# Patient Record
Sex: Male | Born: 1975 | Race: White | Hispanic: No | Marital: Married | Smoking: Never smoker
Health system: Southern US, Community
[De-identification: ages and names within clinical notes are randomized; demographics above are authoritative.]

---

## 2014-11-09 ENCOUNTER — Other Ambulatory Visit: Payer: Self-pay | Admitting: Urology

## 2014-11-09 DIAGNOSIS — N411 Chronic prostatitis: Secondary | ICD-10-CM

## 2014-11-16 ENCOUNTER — Ambulatory Visit
Admission: RE | Admit: 2014-11-16 | Discharge: 2014-11-16 | Disposition: A | Discharge status: Home / Self Care | Payer: 59 | Source: Ambulatory Visit | Attending: Urology | Admitting: Urology

## 2014-11-16 DIAGNOSIS — N411 Chronic prostatitis: Secondary | ICD-10-CM

## 2014-11-16 MED ORDER — IOPAMIDOL (ISOVUE-300) INJECTION 61%
100.0000 mL | Freq: Once | INTRAVENOUS | Status: AC | PRN
  Administered 2014-11-16: 100 mL via INTRAVENOUS

## 2017-05-15 ENCOUNTER — Ambulatory Visit (INDEPENDENT_AMBULATORY_CARE_PROVIDER_SITE_OTHER): Payer: 59 | Admitting: Orthopedic Surgery

## 2017-05-15 ENCOUNTER — Encounter (INDEPENDENT_AMBULATORY_CARE_PROVIDER_SITE_OTHER): Payer: Self-pay | Admitting: Orthopedic Surgery

## 2017-05-15 ENCOUNTER — Ambulatory Visit (INDEPENDENT_AMBULATORY_CARE_PROVIDER_SITE_OTHER): Payer: 59

## 2017-05-15 DIAGNOSIS — M6702 Short Achilles tendon (acquired), left ankle: Secondary | ICD-10-CM

## 2017-05-15 DIAGNOSIS — M79672 Pain in left foot: Secondary | ICD-10-CM | POA: Diagnosis not present

## 2017-05-15 DIAGNOSIS — M7742 Metatarsalgia, left foot: Secondary | ICD-10-CM

## 2017-05-15 DIAGNOSIS — M6701 Short Achilles tendon (acquired), right ankle: Secondary | ICD-10-CM

## 2017-05-15 DIAGNOSIS — M7741 Metatarsalgia, right foot: Secondary | ICD-10-CM

## 2017-05-15 MED ORDER — LIDOCAINE HCL 1 % IJ SOLN
1.0000 mL | INTRAMUSCULAR | Status: AC | PRN
  Administered 2017-05-15: 1 mL

## 2017-05-15 MED ORDER — METHYLPREDNISOLONE ACETATE 40 MG/ML IJ SUSP
40.0000 mg | INTRAMUSCULAR | Status: AC | PRN
  Administered 2017-05-15: 40 mg via INTRA_ARTICULAR

## 2017-05-15 MED ORDER — METHYLPREDNISOLONE ACETATE 40 MG/ML IJ SUSP
20.0000 mg | INTRAMUSCULAR | Status: AC | PRN
  Administered 2017-05-15: 20 mg via INTRA_ARTICULAR

## 2017-05-15 NOTE — Progress Notes (Signed)
Office Visit Note   Patient: Vincent Houston           Date of Birth: [birth year]           MRN: [id number] Visit Date: 05/15/2017              Requested by: No referring provider defined for this encounter. PCP: Sharilyn Sites, MD  Chief Complaint  Patient presents with  . Right Foot - Pain  . 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 He states that he started with symptoms that were more consistent with plantar fasciitis for several months. He has been O [place] for his psoriasis from January to August. Patient has had extensive workup of his foot has had an injection for Morton's neuroma which did not provide any relief he has a carbon plate made for the left shoe which has provided little relief and most recently a diagnosis of a stress fracture of the fourth metatarsal is currently been treated with a bone stimulator for 6 weeks without relief. Patient states his pain is worse in the morning.  Assessment & Plan: Visit Diagnoses:  1. Pain in left foot   2. Achilles tendon contracture, bilateral   3. Metatarsalgia of both feet     Plan: Blood work to see if there is a psoriatic arthritis component to the symptoms. We'll draw a uric acid sedimentation rate CRP rheumatoid factor and ANA. He will discontinue the bone stimulator. Recommended Hoka, trail running shoes with a sol orthotic. Recommended heel cord stretching and this was demonstrated to him. The MTP joints 3 and 4 were injected patient had immediate relief of his symptoms. Patient was given a pair of the medical compression stockings to help resolve the fungal rash on the plantar aspect of his foot.  Follow-Up Instructions: Return in about 1 week (around 05/22/2017).   Ortho Exam  Patient is alert, oriented, no adenopathy, well-dressed,  normal affect, normal respiratory effort. Examination patient has normal gait he has good pulses. He does have heel cord tightness with dorsiflexion only about 10 past neutral bilaterally with the knee extended. He has good ankle good subtalar motion. His foot is neurovascularly intact he has callus beneath the third and fourth metatarsal heads worse on the left foot than the right foot and is starting to develop callus over the fifth metatarsal head from him rolling the foot over to unload the third and fourth metatarsal heads. The web spaces are minimally tender to palpation lower compression and palpation of the third webspace does not produce any Morton's neuroma symptoms. He has maximum tenderness to palpation over the third and fourth metatarsal head and he is most tender to palpation over the third metatarsal head left foot. Review of the MRI scan shows more inflammation in the metatarsal head and joint of the fourth MTP joint.  Imaging: Xr Foot Complete Left  Result Date: 05/15/2017 Three-view radiographs of the left foot shows no bony abnormalities there is a long second and third metatarsal no evidence of stress fractures no evidence of increased callus formation. There is no joint space narrowing. 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  No images are attached to the encounter.  Labs: No results found for: HGBA1C, ESRSEDRATE, CRP, LABURIC, REPTSTATUS, GRAMSTAIN, CULT, LABORGA  Orders:  Orders  Placed This Encounter  Procedures  . XR Foot Complete Left  . Uric acid  . Sed Rate (ESR)  . C-reactive protein  . Rheumatoid Factor  . Antinuclear Antib (ANA)   No orders of the defined types were placed in this encounter.    Procedures: Small Joint Inj Date/Time: 05/15/2017 3:15 PM Performed by: Yaquelin Langelier V Authorized by: Newt Minion   Consent Given by:  Patient Site marked: the procedure site was marked   Timeout: prior to procedure the correct patient, procedure, and site was  verified   Indications:  Pain and diagnostic evaluation Location:  Third toe Site:  L third MTP Prep: patient was prepped and draped in usual sterile fashion   Needle Size:  27 G Spinal Needle: No   Approach:  Dorsal Ultrasound Guided: No   Fluoroscopic Guidance: No   Medications:  40 mg methylPREDNISolone acetate 40 MG/ML; 20 mg methylPREDNISolone acetate 40 MG/ML; 1 mL lidocaine 1 % Aspiration Attempted: No   Patient tolerance:  Patient tolerated the procedure well with no immediate complications Small Joint Inj Date/Time: 05/15/2017 3:15 PM Performed by: Ruhan Borak V Authorized by: Newt Minion   Consent Given by:  Patient Site marked: the procedure site was marked   Timeout: prior to procedure the correct patient, procedure, and site was verified   Indications:  Pain and diagnostic evaluation Location:  Fourth toe Site:  L fourth MTP Prep: patient was prepped and draped in usual sterile fashion   Needle Size:  27 G Spinal Needle: No   Approach:  Dorsal Ultrasound Guided: No   Fluoroscopic Guidance: No   Medications:  1 mL lidocaine 1 %; 40 mg methylPREDNISolone acetate 40 MG/ML Aspiration Attempted: No   Patient tolerance:  Patient tolerated the procedure well with no immediate complications    Clinical Data: No additional findings.  ROS:  All other systems negative, except as noted in the HPI. Review of Systems  Objective: Vital Signs: There were no vitals taken for this visit.  Specialty Comments:  No specialty comments available.  PMFS History: There are no active problems to display for this patient.  History reviewed. No pertinent past medical history.  History reviewed. No pertinent family history.  History reviewed. No pertinent surgical history. Social History   Occupational History  . Not on file.   Social History Main Topics  . Smoking status: Never Smoker  . Smokeless tobacco: Never Used  . Alcohol use No  . Drug use: No  . Sexual  activity: Not on file

## 2017-05-16 LAB — ANA: Anti Nuclear Antibody(ANA): NEGATIVE

## 2017-05-16 LAB — SEDIMENTATION RATE

## 2017-05-16 LAB — RHEUMATOID FACTOR: Rhuematoid fact SerPl-aCnc: <14 IU/mL (ref <14)

## 2017-05-16 LAB — URIC ACID: Uric Acid, Serum: 6.2 mg/dL (ref 4.0–8.0)

## 2017-05-16 LAB — C-REACTIVE PROTEIN: CRP: 7.1 mg/L (ref <8.0)

## 2017-05-22 ENCOUNTER — Ambulatory Visit (INDEPENDENT_AMBULATORY_CARE_PROVIDER_SITE_OTHER): Payer: 59 | Admitting: Orthopedic Surgery

## 2017-05-22 DIAGNOSIS — M7741 Metatarsalgia, right foot: Secondary | ICD-10-CM

## 2017-05-22 DIAGNOSIS — M79672 Pain in left foot: Secondary | ICD-10-CM

## 2017-05-22 DIAGNOSIS — M7742 Metatarsalgia, left foot: Secondary | ICD-10-CM

## 2017-05-22 MED ORDER — COLCHICINE 0.6 MG PO CAPS: 0.6000 mg | ORAL_CAPSULE | Freq: Every day | ORAL | 1 refills | Status: DC | PRN

## 2017-05-22 MED ORDER — ALLOPURINOL 100 MG PO TABS: 100.0000 mg | ORAL_TABLET | Freq: Every day | ORAL | 3 refills | Status: DC

## 2017-05-22 NOTE — Progress Notes (Signed)
Office Visit Note   Patient: Vincent Houston           Date of Birth: 1976-05-26           MRN: [id number] Visit Date: 05/22/2017              Requested by: Assunta Found, MD [address] [place], [place] [address] PCP: Assunta Found, MD  No chief complaint on file.     HPI: Patient presents in follow-up after injection for MTP joints 3 and 4 he did have immediate relief but still has swelling of the third and fourth toes on the left foot swelling of the fourth toe on the right foot. He states the compression stockings are feeling better. His uric acid was 6.2 the remainder of his rheumatologic studies were negative ANA was negative rheumatoid factor was normal C-reactive protein was normal. Patient states that his symptoms were worse when he was in [facility] where he was eating steak every night and drinking red wine every night. With his most recent uric acid of 6.2 it's possible that patient has now symptoms with the possibility of this being psoriatic arthritis as well.  Assessment & Plan: Visit Diagnoses:  1. Pain in left foot   2. Metatarsalgia of both feet     Plan: We will call in a prescription for colchicine 0.6 mg daily transitioning to allopurinol 100 mg daily. If patient does not have significant relief within a week of taking the colchicine he will call and I would then place him on a low-dose prednisone with the assumption that this is more of a psoriatic arthritis if the colchicine does not show a significant improvement in his symptoms.  Follow-Up Instructions: Return if symptoms worsen or fail to improve.   Ortho Exam  Patient is alert, oriented, no adenopathy, well-dressed, normal affect, normal respiratory effort. Examination the fungal rash beneath his toes and on his heel is improved after wearing the medical compression stocking. He does have swelling of the third and fourth toes the left foot swelling of the fourth toe on the right foot with a little bit of  swelling in the fifth toe of the right foot. There is no cellulitis he does have tenderness he has callus beneath the metatarsal heads of the midfoot on the left foot he has improved dorsiflexion the ankle. He is currently wearing Hoka sneakers and he states that these are an improvement. He does have sole orthotics and recommend using these in the sneakers as well.  Imaging: No results found. No images are attached to the encounter.  Labs: Lab Results  Component Value Date   ESRSEDRATE CANCELED 05/15/2017   CRP 7.1 05/15/2017   LABURIC 6.2 05/15/2017    Orders:  No orders of the defined types were placed in this encounter.  No orders of the defined types were placed in this encounter.    Procedures: No procedures performed  Clinical Data: No additional findings.  ROS:  All other systems negative, except as noted in the HPI. Review of Systems  Objective: Vital Signs: There were no vitals taken for this visit.  Specialty Comments:  No specialty comments available.  PMFS History: There are no active problems to display for this patient.  No past medical history on file.  No family history on file.  No past surgical history on file. Social History   Occupational History  . Not on file.   Social History Main Topics  . Smoking status: Never Smoker  .  Smokeless tobacco: Never Used  . Alcohol use No  . Drug use: No  . Sexual activity: Not on file

## 2017-05-29 ENCOUNTER — Other Ambulatory Visit (INDEPENDENT_AMBULATORY_CARE_PROVIDER_SITE_OTHER): Payer: Self-pay | Admitting: Orthopedic Surgery

## 2017-05-29 MED ORDER — PREDNISONE 10 MG PO TABS: 20.0000 mg | ORAL_TABLET | Freq: Every day | ORAL | 0 refills | Status: DC

## 2017-05-29 NOTE — Progress Notes (Signed)
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

## 2017-06-08 ENCOUNTER — Telehealth (INDEPENDENT_AMBULATORY_CARE_PROVIDER_SITE_OTHER): Payer: Self-pay | Admitting: Radiology

## 2017-06-08 MED ORDER — PREDNISONE 10 MG PO TABS: 20.0000 mg | ORAL_TABLET | Freq: Every day | ORAL | 0 refills | Status: DC

## 2017-06-08 NOTE — Telephone Encounter (Signed)
Patient needing refill on prednisone, rx sent into his pharmacy per MD.

## 2020-07-20 DIAGNOSIS — L4059 Other psoriatic arthropathy: Secondary | ICD-10-CM | POA: Diagnosis not present

## 2020-07-20 DIAGNOSIS — Z111 Encounter for screening for respiratory tuberculosis: Secondary | ICD-10-CM | POA: Diagnosis not present

## 2020-07-20 DIAGNOSIS — Z79899 Other long term (current) drug therapy: Secondary | ICD-10-CM | POA: Diagnosis not present

## 2020-07-20 DIAGNOSIS — L400 Psoriasis vulgaris: Secondary | ICD-10-CM | POA: Diagnosis not present

## 2020-08-19 DIAGNOSIS — Z20822 Contact with and (suspected) exposure to covid-19: Secondary | ICD-10-CM | POA: Diagnosis not present

## 2020-08-31 DIAGNOSIS — R5383 Other fatigue: Secondary | ICD-10-CM | POA: Diagnosis not present

## 2020-08-31 DIAGNOSIS — E559 Vitamin D deficiency, unspecified: Secondary | ICD-10-CM | POA: Diagnosis not present

## 2020-08-31 DIAGNOSIS — R635 Abnormal weight gain: Secondary | ICD-10-CM | POA: Diagnosis not present

## 2020-09-07 DIAGNOSIS — R635 Abnormal weight gain: Secondary | ICD-10-CM | POA: Diagnosis not present

## 2020-09-07 DIAGNOSIS — R5383 Other fatigue: Secondary | ICD-10-CM | POA: Diagnosis not present

## 2020-09-07 DIAGNOSIS — E559 Vitamin D deficiency, unspecified: Secondary | ICD-10-CM | POA: Diagnosis not present

## 2020-09-07 DIAGNOSIS — E237 Disorder of pituitary gland, unspecified: Secondary | ICD-10-CM | POA: Diagnosis not present

## 2020-09-09 DIAGNOSIS — E559 Vitamin D deficiency, unspecified: Secondary | ICD-10-CM | POA: Diagnosis not present

## 2020-09-09 DIAGNOSIS — E237 Disorder of pituitary gland, unspecified: Secondary | ICD-10-CM | POA: Diagnosis not present

## 2020-09-09 DIAGNOSIS — R635 Abnormal weight gain: Secondary | ICD-10-CM | POA: Diagnosis not present

## 2020-09-09 DIAGNOSIS — R5383 Other fatigue: Secondary | ICD-10-CM | POA: Diagnosis not present

## 2020-09-20 DIAGNOSIS — R030 Elevated blood-pressure reading, without diagnosis of hypertension: Secondary | ICD-10-CM | POA: Diagnosis not present

## 2020-09-20 DIAGNOSIS — L409 Psoriasis, unspecified: Secondary | ICD-10-CM | POA: Diagnosis not present

## 2020-09-20 DIAGNOSIS — L4050 Arthropathic psoriasis, unspecified: Secondary | ICD-10-CM | POA: Diagnosis not present

## 2020-09-20 DIAGNOSIS — R079 Chest pain, unspecified: Secondary | ICD-10-CM | POA: Diagnosis not present

## 2020-09-22 ENCOUNTER — Ambulatory Visit (INDEPENDENT_AMBULATORY_CARE_PROVIDER_SITE_OTHER): Payer: BC Managed Care – PPO | Admitting: Cardiovascular Disease

## 2020-09-22 ENCOUNTER — Other Ambulatory Visit: Payer: Self-pay

## 2020-09-22 ENCOUNTER — Encounter: Payer: Self-pay | Admitting: Cardiovascular Disease

## 2020-09-22 DIAGNOSIS — R0789 Other chest pain: Secondary | ICD-10-CM

## 2020-09-22 DIAGNOSIS — I10 Essential (primary) hypertension: Secondary | ICD-10-CM | POA: Diagnosis not present

## 2020-09-22 DIAGNOSIS — Z8249 Family history of ischemic heart disease and other diseases of the circulatory system: Secondary | ICD-10-CM | POA: Diagnosis not present

## 2020-09-22 NOTE — Addendum Note (Signed)
Addended by: Bernita Buffy on: 09/22/2020 10:01 AM   Modules accepted: Orders

## 2020-09-22 NOTE — Assessment & Plan Note (Signed)
Mr. Vincent Houston was referred to me by Dr. Phillips Odor for evaluation of new onset hypertension and atypical chest pain. He apparently had Covid last year and he thinks again earlier this year. He was vaccinated in December. His wife and daughter tested positive in early January after the family came back from the [place] on a trip. He apparently went to a local ER for IV fluids because of symptoms of dehydration and was noted to be significantly hypertensive. He brought a digital blood pressure cuff and demonstrated this reproducibly at home as well. He was in [place] on business and was prescribed Benicar which she took for 3 days and discontinued it. Blood pressure today is 132/82. He feels his blood pressure is coming back towards normal. It certainly possible that his blood pressure spike was related to Covid vaccine seen versus Covid infection due to binding of the H2 receptor. Encouraged by the fact that his blood pressure is better on no medication. I encouraged him to avoid salt and continue to monitor his blood pressure at home.

## 2020-09-22 NOTE — Progress Notes (Signed)
09/22/2020 Ignazio Kincaid   November 14, 1975  [id number]  Primary Physician Assunta Found, MD Primary Cardiologist: Runell Gess MD Nicholes Calamity, [place]  HPI:  Jaquay Posthumus is a 45 y.o. thin appearing married Caucasian male father of 2 young children referred by Dr. Phillips Odor, his PCP for evaluation of new onset hypertension and pleuritic chest pain. 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 He does not smoke. He does drink socially . There is a family history of heart disease with father who had myocardial infarction in his 9s and had stents. He has never had a heart tachycardia or stroke. He did have COVID-19 last year and symptomatically earlier this year when his wife and child tested positive after spending the holidays in the [place]. He did receive his Covid shot in December. He apparently went to get IV fluids with his wife after by his account "partying" and was noted to be significantly hypertensive. He flew out to [place] on business trip and was started on Benicar by his PCP which he took for 3 days and then stopped. Blood pressure is significantly improved. His chest pain which is pleuritic in nature is improved as well.   Current Meds  Medication Sig  . Adalimumab (HUMIRA PEN) 40 MG/0.4ML PNKT   . olmesartan (BENICAR) 40 MG tablet Take 40 mg by mouth daily.     No Known Allergies  Social History   Socioeconomic History  . Marital status: Married    Spouse name: Not on file  . Number of children: Not on file  . Years of education: Not on file  . Highest education level: Not on file  Occupational History  . Not on file  Tobacco Use  . Smoking status: Never Smoker  . Smokeless tobacco: Never Used  Substance and Sexual Activity  . Alcohol use: No  . Drug use: No  . Sexual activity: Not on file  Other Topics Concern  . Not on file  Social History Narrative  . Not on file   Social Determinants of  Health   Financial Resource Strain: Not on file  Food Insecurity: Not on file  Transportation Needs: Not on file  Physical Activity: Not on file  Stress: Not on file  Social Connections: Not on file  Intimate Partner Violence: Not on file     Review of Systems: General: negative for chills, fever, night sweats or weight changes.  Cardiovascular: negative for chest pain, dyspnea on exertion, edema, orthopnea, palpitations, paroxysmal nocturnal dyspnea or shortness of breath Dermatological: negative for rash Respiratory: negative for cough or wheezing Urologic: negative for hematuria Abdominal: negative for nausea, vomiting, diarrhea, bright red blood per rectum, melena, or hematemesis Neurologic: negative for visual changes, syncope, or dizziness All other systems reviewed and are otherwise negative except as noted above.    Blood pressure 132/82, pulse (!) 52, height 6' (1.829 m), weight 184 lb 12.8 oz (83.8 kg).  General appearance: alert and no distress Neck: no adenopathy, no carotid bruit, no JVD, supple, symmetrical, trachea midline and thyroid not enlarged, symmetric, no tenderness/mass/nodules Lungs: clear to auscultation bilaterally Heart: regular rate and rhythm, S1, S2 normal, no murmur, click, rub or gallop Extremities: extremities normal, atraumatic, no cyanosis or edema Pulses: 2+ and symmetric Skin: Skin color, texture, turgor normal. No rashes or lesions Neurologic: Alert and oriented X 3, normal strength and tone. Normal symmetric reflexes. Normal coordination and gait  EKG  sinus bradycardia 52 with borderline LVH voltage. I personally reviewed this EKG.  ASSESSMENT AND PLAN:   Essential hypertension Mr. Derryl Harbor was referred to me by Dr. Phillips Odor for evaluation of new onset hypertension and atypical chest pain. He apparently had Covid last year and he thinks again earlier this year. He was vaccinated in December. His wife and daughter tested positive in early  January after the family came back from the [place] on a trip. He apparently went to a local ER for IV fluids because of symptoms of dehydration and was noted to be significantly hypertensive. He brought a digital blood pressure cuff and demonstrated this reproducibly at home as well. He was in [place] on business and was prescribed Benicar which she took for 3 days and discontinued it. Blood pressure today is 132/82. He feels his blood pressure is coming back towards normal. It certainly possible that his blood pressure spike was related to Covid vaccine seen versus Covid infection due to binding of the H2 receptor. Encouraged by the fact that his blood pressure is better on no medication. I encouraged him to avoid salt and continue to monitor his blood pressure at home.  Atypical chest pain Mr. Derryl Harbor was referred for hypertension atypical chest pain. The pain is mostly pleuritic. It is significantly improved. I am going to get a D-dimer, sed rate and CRP as well as a 2D echo and coronary calcium score to further evaluate.  Family history of heart disease Father had a myocardial infarction in multiple stents beginning in his 23s.      Runell Gess MD FACP,FACC,FAHA, [facility] 09/22/2020 9:49 AM

## 2020-09-22 NOTE — Assessment & Plan Note (Signed)
Mr. Vincent Houston was referred for hypertension atypical chest pain. The pain is mostly pleuritic. It is significantly improved. I am going to get a D-dimer, sed rate and CRP as well as a 2D echo and coronary calcium score to further evaluate.

## 2020-09-22 NOTE — Assessment & Plan Note (Signed)
Father had a myocardial infarction in multiple stents beginning in his 14s.

## 2020-09-22 NOTE — Patient Instructions (Addendum)
Medication Instructions:  Your physician recommends that you continue on your current medications as directed. Please refer to the Current Medication list given to you today.  *If you need a refill on your cardiac medications before your next appointment, please call your pharmacy*   Lab Work: Your physician recommends that you have labs today: Lipid/liver profile, D-dimer, CRP & Sed rate  If you have labs (blood work) drawn today and your tests are completely normal, you will receive your results only by: Marland Kitchen MyChart Message (if you have MyChart) OR . A paper copy in the mail If you have any lab test that is abnormal or we need to change your treatment, we will call you to review the results.   Testing/Procedures: Your physician has requested that you have an echocardiogram. Echocardiography is a painless test that uses sound waves to create images of your heart. It provides your doctor with information about the size and shape of your heart and how well your heart's chambers and valves are working. This procedure takes approximately one hour. There are no restrictions for this procedure. This will be done at [address] [place], [address]rd Floor  Dr. Allyson Sabal has ordered a CT coronary calcium score. This test is done at [address]. This is $99 out of pocket.   Coronary CalciumScan A coronary calcium scan is an imaging test used to look for deposits of calcium and other fatty materials (plaques) in the inner lining of the blood vessels of the heart (coronary arteries). These deposits of calcium and plaques can partly clog and narrow the coronary arteries without producing any symptoms or warning signs. This puts a person at risk for a heart attack. This test can detect these deposits before symptoms develop. Tell a health care provider about:  Any allergies you have.  All medicines you are taking, including vitamins, herbs, eye drops, creams, and over-the-counter  medicines.  Any problems you or family members have had with anesthetic medicines.  Any blood disorders you have.  Any surgeries you have had.  Any medical conditions you have.  Whether you are pregnant or may be pregnant. What are the risks? Generally, this is a safe procedure. However, problems may occur, including:  Harm to a pregnant woman and her unborn baby. This test involves the use of radiation. Radiation exposure can be dangerous to a pregnant woman and her unborn baby. If you are pregnant, you generally should not have this procedure done.  Slight increase in the risk of cancer. This is because of the radiation involved in the test. What happens before the procedure? No preparation is needed for this procedure. What happens during the procedure?  You will undress and remove any jewelry around your neck or chest.  You will put on a hospital gown.  Sticky electrodes will be placed on your chest. The electrodes will be connected to an electrocardiogram (ECG) machine to record a tracing of the electrical activity of your heart.  A CT scanner will take pictures of your heart. During this time, you will be asked to lie still and hold your breath for 2-3 seconds while a picture of your heart is being taken. The procedure may vary among health care providers and hospitals. What happens after the procedure?  You can get dressed.  You can return to your normal activities.  It is up to you to get the results of your test. Ask your health care provider, or the department that is  doing the test, when your results will be ready. Summary  A coronary calcium scan is an imaging test used to look for deposits of calcium and other fatty materials (plaques) in the inner lining of the blood vessels of the heart (coronary arteries).  Generally, this is a safe procedure. Tell your health care provider if you are pregnant or may be pregnant.  No preparation is needed for this  procedure.  A CT scanner will take pictures of your heart.  You can return to your normal activities after the scan is done. This information is not intended to replace advice given to you by your health care provider. Make sure you discuss any questions you have with your health care provider. Document Released: 02/10/2008 Document Revised: 07/03/2016 Document Reviewed: 07/03/2016 Elsevier Interactive Patient Education  2017 [employer].    Follow-Up: At [facility], you and your health needs are our priority.  As part of our continuing mission to provide you with exceptional heart care, we have created designated Provider Care Teams.  These Care Teams include your primary Cardiologist (physician) and Advanced Practice Providers (APPs -  Physician Assistants and Nurse Practitioners) who all work together to provide you with the care you need, when you need it.  We recommend signing up for the patient portal called "MyChart".  Sign up information is provided on this After Visit Summary.  MyChart is used to connect with patients for Virtual Visits (Telemedicine).  Patients are able to view lab/test results, encounter notes, upcoming appointments, etc.  Non-urgent messages can be sent to your provider as well.   To learn more about what you can do with MyChart, go to [email or web address].    Your next appointment:   2 month(s)  The format for your next appointment:   In Person  Provider:   Nanetta Batty, MD

## 2020-10-08 ENCOUNTER — Telehealth (HOSPITAL_COMMUNITY): Payer: Self-pay | Admitting: Emergency Medicine

## 2020-10-08 ENCOUNTER — Other Ambulatory Visit: Payer: Self-pay | Admitting: *Deleted

## 2020-10-08 DIAGNOSIS — Z8249 Family history of ischemic heart disease and other diseases of the circulatory system: Secondary | ICD-10-CM

## 2020-10-08 DIAGNOSIS — R079 Chest pain, unspecified: Secondary | ICD-10-CM

## 2020-10-08 NOTE — Telephone Encounter (Signed)
Reaching out to patient to offer assistance regarding upcoming cardiac imaging study; pt verbalizes understanding of appt date/time, parking situation and where to check in, pre-test NPO status and medications ordered, and verified current allergies; name and call back number provided for further questions should they arise Korissa Horsford RN Navigator Cardiac Imaging [facility] [phone] office [phone] cell 

## 2020-10-11 ENCOUNTER — Ambulatory Visit (HOSPITAL_COMMUNITY)
Admission: RE | Admit: 2020-10-11 | Discharge: 2020-10-11 | Disposition: A | Discharge status: Home / Self Care | Payer: BC Managed Care – PPO | Source: Ambulatory Visit | Attending: Cardiovascular Disease | Admitting: Cardiovascular Disease

## 2020-10-11 ENCOUNTER — Other Ambulatory Visit: Payer: Self-pay

## 2020-10-11 DIAGNOSIS — N419 Inflammatory disease of prostate, unspecified: Secondary | ICD-10-CM | POA: Diagnosis not present

## 2020-10-11 DIAGNOSIS — R079 Chest pain, unspecified: Secondary | ICD-10-CM

## 2020-10-11 DIAGNOSIS — Z8249 Family history of ischemic heart disease and other diseases of the circulatory system: Secondary | ICD-10-CM | POA: Insufficient documentation

## 2020-10-11 MED ORDER — NITROGLYCERIN 0.4 MG SL SUBL
0.8000 mg | SUBLINGUAL_TABLET | Freq: Once | SUBLINGUAL | Status: AC
  Administered 2020-10-11: 0.4 mg via SUBLINGUAL
  Administered 2020-10-11: 0.8 mg via SUBLINGUAL

## 2020-10-11 MED ORDER — METOPROLOL TARTRATE 5 MG/5ML IV SOLN
5.0000 mg | INTRAVENOUS | Status: DC | PRN
  Administered 2020-10-11: 5 mg via INTRAVENOUS

## 2020-10-11 MED ORDER — NITROGLYCERIN 0.4 MG SL SUBL
SUBLINGUAL_TABLET | SUBLINGUAL | Status: AC
  Filled 2020-10-11: qty 2

## 2020-10-11 MED ORDER — METOPROLOL TARTRATE 5 MG/5ML IV SOLN
INTRAVENOUS | Status: AC
  Filled 2020-10-11: qty 5

## 2020-10-11 MED ORDER — NITROGLYCERIN 0.4 MG SL SUBL
SUBLINGUAL_TABLET | SUBLINGUAL | Status: AC
  Filled 2020-10-11: qty 1

## 2020-10-11 MED ORDER — IOHEXOL 350 MG/ML SOLN
80.0000 mL | Freq: Once | INTRAVENOUS | Status: AC | PRN
  Administered 2020-10-11: 80 mL via INTRAVENOUS

## 2020-10-12 ENCOUNTER — Encounter: Payer: Self-pay | Admitting: *Deleted

## 2020-10-12 ENCOUNTER — Telehealth: Payer: Self-pay

## 2020-10-12 ENCOUNTER — Other Ambulatory Visit: Payer: Self-pay

## 2020-10-12 ENCOUNTER — Ambulatory Visit (INDEPENDENT_AMBULATORY_CARE_PROVIDER_SITE_OTHER): Payer: BC Managed Care – PPO

## 2020-10-12 DIAGNOSIS — R002 Palpitations: Secondary | ICD-10-CM

## 2020-10-12 NOTE — Progress Notes (Signed)
Patient ID: Vincent Houston, male   DOB: 10-Apr-1976, 45 y.o.   MRN: [id number] Patient enrolled for [employer] to ship a 14 day ZIO XT long term holter monitor to his home. Letter with instructions mailed to patient.

## 2020-10-12 NOTE — Telephone Encounter (Signed)
Attempted to call pt regarding 2 week zio per Dr. Allyson Sabal.  Left message for pt to call back. Orders for 14 day zio placed and letter with instructions placed in the mail.

## 2020-10-16 DIAGNOSIS — N200 Calculus of kidney: Secondary | ICD-10-CM | POA: Diagnosis not present

## 2020-10-16 DIAGNOSIS — N132 Hydronephrosis with renal and ureteral calculous obstruction: Secondary | ICD-10-CM | POA: Diagnosis not present

## 2020-10-16 DIAGNOSIS — R109 Unspecified abdominal pain: Secondary | ICD-10-CM | POA: Diagnosis not present

## 2020-10-19 ENCOUNTER — Encounter (HOSPITAL_COMMUNITY): Payer: Self-pay

## 2020-10-19 ENCOUNTER — Other Ambulatory Visit (HOSPITAL_COMMUNITY): Payer: BC Managed Care – PPO

## 2020-10-19 ENCOUNTER — Encounter (HOSPITAL_COMMUNITY): Payer: Self-pay | Admitting: Cardiovascular Disease

## 2020-10-19 NOTE — Progress Notes (Signed)
Verified appointment "no show" status with L. Walters at 08:32.  

## 2020-11-02 ENCOUNTER — Telehealth (HOSPITAL_COMMUNITY): Payer: Self-pay | Admitting: Cardiovascular Disease

## 2020-11-02 NOTE — Telephone Encounter (Signed)
Just an FYI. We have made several attempts to contact this patient including sending a letter to schedule or reschedule their echocardiogram. We will be removing the patient from the echo WQ.   10/19/20 NO SHOW-MAILED LETTER [facility]     Thank you

## 2020-11-04 DIAGNOSIS — R002 Palpitations: Secondary | ICD-10-CM | POA: Diagnosis not present

## 2021-04-18 DIAGNOSIS — M7041 Prepatellar bursitis, right knee: Secondary | ICD-10-CM | POA: Diagnosis not present

## 2021-06-06 DIAGNOSIS — I10 Essential (primary) hypertension: Secondary | ICD-10-CM | POA: Diagnosis not present

## 2021-06-06 DIAGNOSIS — Z6824 Body mass index (BMI) 24.0-24.9, adult: Secondary | ICD-10-CM | POA: Diagnosis not present

## 2021-06-06 DIAGNOSIS — L409 Psoriasis, unspecified: Secondary | ICD-10-CM | POA: Diagnosis not present

## 2021-06-06 DIAGNOSIS — Z1389 Encounter for screening for other disorder: Secondary | ICD-10-CM | POA: Diagnosis not present

## 2021-06-06 DIAGNOSIS — L4050 Arthropathic psoriasis, unspecified: Secondary | ICD-10-CM | POA: Diagnosis not present

## 2021-06-06 DIAGNOSIS — R300 Dysuria: Secondary | ICD-10-CM | POA: Diagnosis not present

## 2021-06-09 DIAGNOSIS — N5082 Scrotal pain: Secondary | ICD-10-CM | POA: Diagnosis not present

## 2021-06-09 DIAGNOSIS — N433 Hydrocele, unspecified: Secondary | ICD-10-CM | POA: Diagnosis not present

## 2021-06-10 DIAGNOSIS — N200 Calculus of kidney: Secondary | ICD-10-CM | POA: Diagnosis not present

## 2021-06-10 DIAGNOSIS — N411 Chronic prostatitis: Secondary | ICD-10-CM | POA: Diagnosis not present

## 2021-06-10 DIAGNOSIS — R102 Pelvic and perineal pain: Secondary | ICD-10-CM | POA: Diagnosis not present

## 2021-06-14 DIAGNOSIS — N209 Urinary calculus, unspecified: Secondary | ICD-10-CM | POA: Diagnosis not present

## 2021-06-29 DIAGNOSIS — Z9225 Personal history of immunosupression therapy: Secondary | ICD-10-CM | POA: Diagnosis not present

## 2021-06-29 DIAGNOSIS — Z111 Encounter for screening for respiratory tuberculosis: Secondary | ICD-10-CM | POA: Diagnosis not present

## 2021-06-29 DIAGNOSIS — L400 Psoriasis vulgaris: Secondary | ICD-10-CM | POA: Diagnosis not present

## 2021-06-29 DIAGNOSIS — L4059 Other psoriatic arthropathy: Secondary | ICD-10-CM | POA: Diagnosis not present

## 2021-06-29 DIAGNOSIS — Z79899 Other long term (current) drug therapy: Secondary | ICD-10-CM | POA: Diagnosis not present

## 2021-07-14 IMAGING — CT CT HEART MORP W/ CTA COR W/ SCORE W/ CA W/CM &/OR W/O CM
4 of 7 series · 8 of 20 positions shown, 9 images · non-contrast
Comparison: None.
COMPARISON: None.

Addendum:
EXAM:
OVER-READ INTERPRETATION  CT CHEST

The following report is an over-read performed by radiologist Dr.
Nakeya Farms [[email or web address]] on 10/11/2020. This over-read
does not include interpretation of cardiac or coronary anatomy or
pathology. The coronary CTA interpretation by the cardiologist is
attached.
CLINICAL DATA: Chest pain
Cardiac/Coronary CTA
TECHNIQUE: The patient was scanned on a [employer] scanner. A 110 kV
prospective scan was triggered in the descending thoracic aorta at
111 HU's. Axial non-contrast 3 mm slices were carried out through
the heart. The data set was analyzed on a dedicated work station and
scored using the Agatson method. Gantry rotation speed was 250 msecs
and collimation was .6 mm. 5 mg IV metoprolol and 0.8 mg of sl NTG
was given. The 3D data set was reconstructed in 5% intervals of the
35-75 % of the R-R cycle. Diastolic phases were analyzed on a
dedicated work station using MPR, MIP and VRT modes. The patient
received 80 cc of contrast.

[Series 6: best diast 72 % · axial · 0.39mm/px · z∈[+1178,+1221]mm · 2 of 328 slices shown, 3 images]
[im 110/328  vessel]
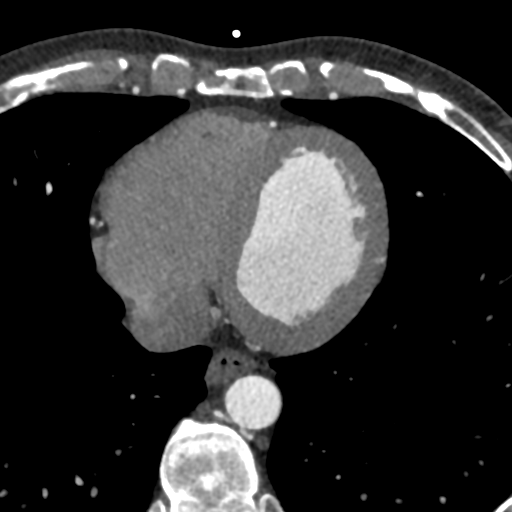
[im 110/328  lung]
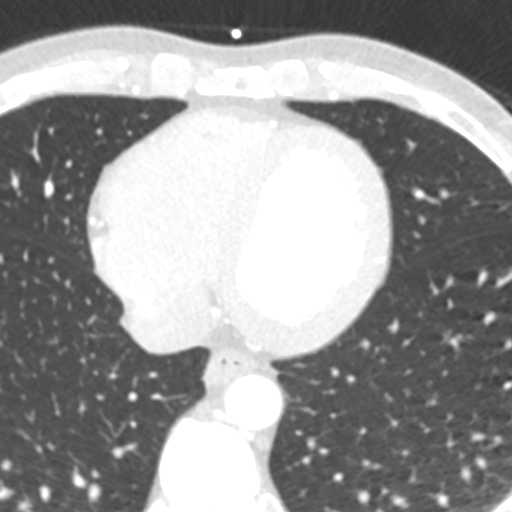
[im 219/328  vessel]
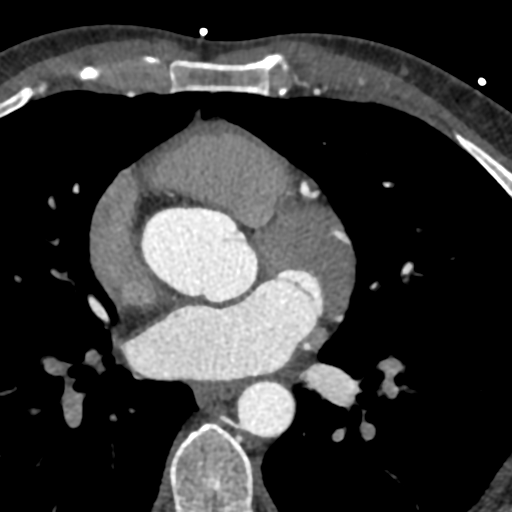

[Series 7: best syst 28 % · axial · 0.39mm/px · z∈[+1178,+1221]mm · 2 of 328 slices shown]
[im 110/328  vessel]
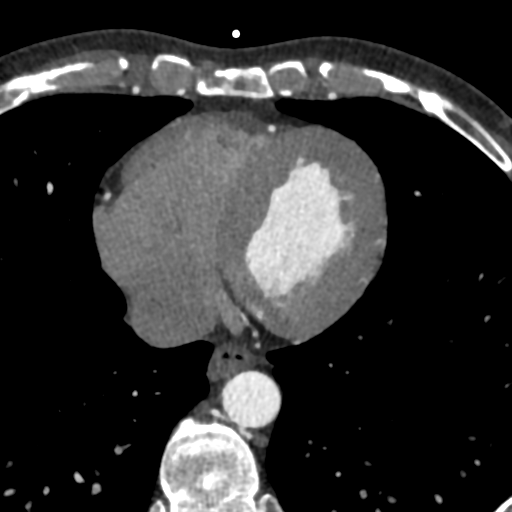
[im 219/328  vessel]
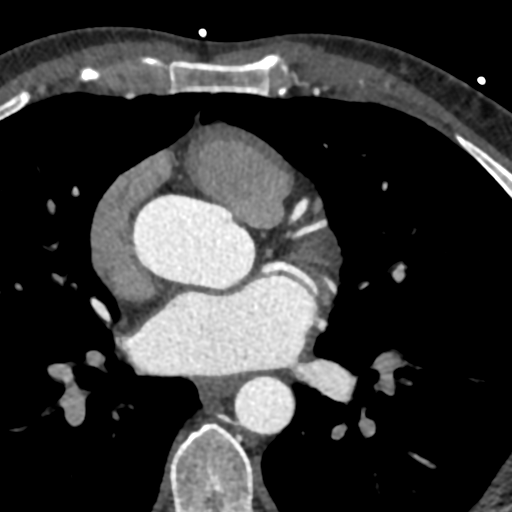

[Series 8: ts diast sharp 72 % · axial · 0.39mm/px · z∈[+1178,+1221]mm · 2 of 328 slices shown]
[im 110/328  lung]
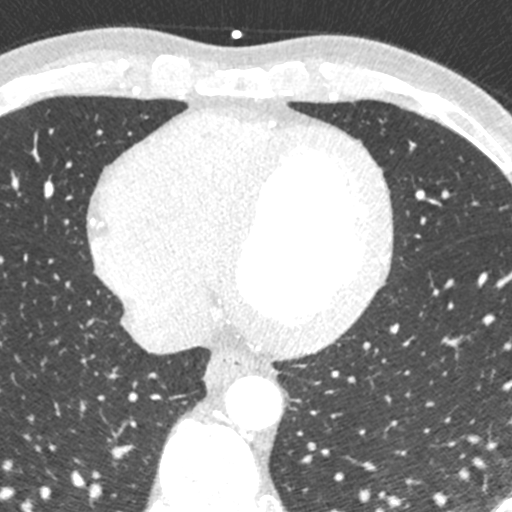
[im 219/328  lung]
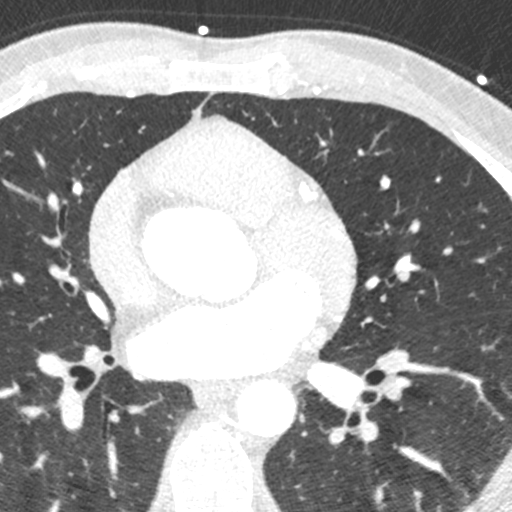

[Series 9: ts syst sharp 28 % · axial · 0.39mm/px · z∈[+1178,+1221]mm · 2 of 328 slices shown]
[im 110/328  lung]
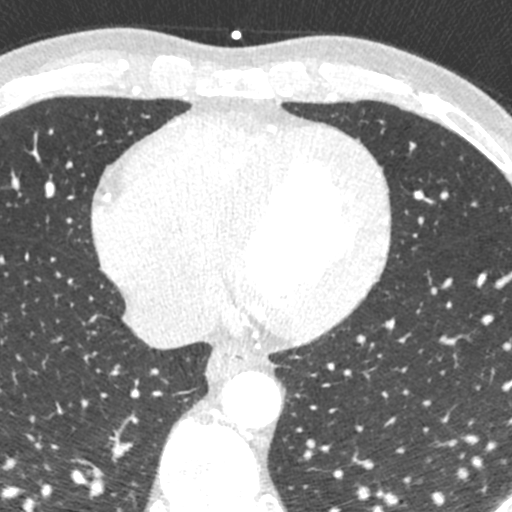
[im 219/328  lung]
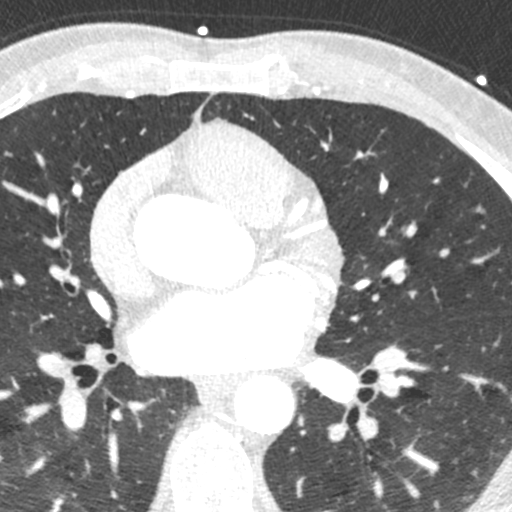

[8 of 20 positions shown; findings below may reference images not displayed]

FINDINGS: Vascular: Heart is normal size.  Aorta normal caliber.

Mediastinum/Nodes: No adenopathy.

Lungs/Pleura: No confluent opacities or effusions.

Upper Abdomen: Imaging into the upper abdomen demonstrates no acute
findings.

Musculoskeletal: Mild bilateral gynecomastia. No acute bony
abnormality.
IMPRESSION: No acute extra cardiac abnormality.

Mild bilateral gynecomastia.
FINDINGS: Image quality: Excellent.

Noise artifact is: Limited.

Coronary Arteries:  Normal coronary origin.  Right dominance.

Left main: The left main is a large caliber vessel with a normal
take off from the left coronary cusp that bifurcates to form a left
anterior descending artery and a left circumflex artery. There is no
plaque or stenosis.

Left anterior descending artery: The LAD is patent without evidence
of plaque or stenosis. The LAD gives off 3 patent diagonal branches.

Left circumflex artery: The LCX is non-dominant and patent with no
evidence of plaque or stenosis. The LCX gives off 2 patent obtuse
marginal branches.

Right coronary artery: The RCA is dominant with normal take off from
the right coronary cusp. There is no evidence of plaque or stenosis.
The RCA terminates as a PDA without evidence of plaque or stenosis.

Right Atrium: Right atrial size is within normal limits.

Right Ventricle: The right ventricular cavity is within normal
limits.

Left Atrium: Left atrial size is normal in size with no left atrial
appendage filling defect.

Left Ventricle: The ventricular cavity size is within normal limits.
There are no stigmata of prior infarction. There is no abnormal
filling defect.

Pulmonary arteries: Normal in size without proximal filling defect.

Pulmonary veins: Normal pulmonary venous drainage.

Pericardium: Normal thickness with no significant effusion or
calcium present.

Cardiac valves: The aortic valve is trileaflet without significant
calcification. The mitral valve is normal structure without
significant calcification.

Aorta: Normal caliber with no significant disease.

Extra-cardiac findings: See attached radiology report for
non-cardiac structures.
IMPRESSION: 1. Coronary calcium score of 0.

2. Normal coronary origin with right dominance.

3. Normal coronary arteries.

RECOMMENDATIONS:
1. No evidence of CAD (0%). Consider non-atherosclerotic causes of
chest pain.

*** End of Addendum ***
EXAM:
OVER-READ INTERPRETATION  CT CHEST

The following report is an over-read performed by radiologist Dr.
Nakeya Farms [[email or web address]] on 10/11/2020. This over-read
does not include interpretation of cardiac or coronary anatomy or
pathology. The coronary CTA interpretation by the cardiologist is
attached.
FINDINGS: Vascular: Heart is normal size.  Aorta normal caliber.

Mediastinum/Nodes: No adenopathy.

Lungs/Pleura: No confluent opacities or effusions.

Upper Abdomen: Imaging into the upper abdomen demonstrates no acute
findings.

Musculoskeletal: Mild bilateral gynecomastia. No acute bony
abnormality.
IMPRESSION: No acute extra cardiac abnormality.

Mild bilateral gynecomastia.

## 2021-09-05 DIAGNOSIS — H66002 Acute suppurative otitis media without spontaneous rupture of ear drum, left ear: Secondary | ICD-10-CM | POA: Diagnosis not present

## 2021-09-11 DIAGNOSIS — H6982 Other specified disorders of Eustachian tube, left ear: Secondary | ICD-10-CM | POA: Diagnosis not present

## 2021-09-11 DIAGNOSIS — H6690 Otitis media, unspecified, unspecified ear: Secondary | ICD-10-CM | POA: Diagnosis not present

## 2021-09-21 DIAGNOSIS — M5431 Sciatica, right side: Secondary | ICD-10-CM | POA: Diagnosis not present

## 2021-09-21 DIAGNOSIS — M1990 Unspecified osteoarthritis, unspecified site: Secondary | ICD-10-CM | POA: Diagnosis not present

## 2021-12-12 DIAGNOSIS — R030 Elevated blood-pressure reading, without diagnosis of hypertension: Secondary | ICD-10-CM | POA: Diagnosis not present

## 2021-12-12 DIAGNOSIS — I10 Essential (primary) hypertension: Secondary | ICD-10-CM | POA: Diagnosis not present

## 2021-12-12 DIAGNOSIS — R300 Dysuria: Secondary | ICD-10-CM | POA: Diagnosis not present

## 2021-12-12 DIAGNOSIS — L409 Psoriasis, unspecified: Secondary | ICD-10-CM | POA: Diagnosis not present

## 2021-12-12 DIAGNOSIS — L4050 Arthropathic psoriasis, unspecified: Secondary | ICD-10-CM | POA: Diagnosis not present

## 2021-12-12 DIAGNOSIS — Z6824 Body mass index (BMI) 24.0-24.9, adult: Secondary | ICD-10-CM | POA: Diagnosis not present

## 2021-12-12 DIAGNOSIS — Z113 Encounter for screening for infections with a predominantly sexual mode of transmission: Secondary | ICD-10-CM | POA: Diagnosis not present

## 2022-04-24 DIAGNOSIS — R5383 Other fatigue: Secondary | ICD-10-CM | POA: Diagnosis not present

## 2022-04-24 DIAGNOSIS — R202 Paresthesia of skin: Secondary | ICD-10-CM | POA: Diagnosis not present

## 2022-04-24 DIAGNOSIS — M2550 Pain in unspecified joint: Secondary | ICD-10-CM | POA: Diagnosis not present

## 2022-04-24 DIAGNOSIS — E559 Vitamin D deficiency, unspecified: Secondary | ICD-10-CM | POA: Diagnosis not present

## 2022-05-16 DIAGNOSIS — R5383 Other fatigue: Secondary | ICD-10-CM | POA: Diagnosis not present

## 2022-05-16 DIAGNOSIS — E559 Vitamin D deficiency, unspecified: Secondary | ICD-10-CM | POA: Diagnosis not present

## 2022-05-16 DIAGNOSIS — R635 Abnormal weight gain: Secondary | ICD-10-CM | POA: Diagnosis not present

## 2022-05-16 DIAGNOSIS — M2550 Pain in unspecified joint: Secondary | ICD-10-CM | POA: Diagnosis not present

## 2023-01-19 DIAGNOSIS — I10 Essential (primary) hypertension: Secondary | ICD-10-CM | POA: Diagnosis not present

## 2023-01-19 DIAGNOSIS — Z0000 Encounter for general adult medical examination without abnormal findings: Secondary | ICD-10-CM | POA: Diagnosis not present

## 2023-01-19 DIAGNOSIS — L409 Psoriasis, unspecified: Secondary | ICD-10-CM | POA: Diagnosis not present

## 2023-01-19 DIAGNOSIS — L4050 Arthropathic psoriasis, unspecified: Secondary | ICD-10-CM | POA: Diagnosis not present

## 2023-01-19 DIAGNOSIS — Z6824 Body mass index (BMI) 24.0-24.9, adult: Secondary | ICD-10-CM | POA: Diagnosis not present

## 2023-01-19 DIAGNOSIS — R030 Elevated blood-pressure reading, without diagnosis of hypertension: Secondary | ICD-10-CM | POA: Diagnosis not present

## 2023-01-19 DIAGNOSIS — Z139 Encounter for screening, unspecified: Secondary | ICD-10-CM | POA: Diagnosis not present

## 2023-10-09 DIAGNOSIS — R300 Dysuria: Secondary | ICD-10-CM | POA: Diagnosis not present

## 2023-10-09 DIAGNOSIS — Z0000 Encounter for general adult medical examination without abnormal findings: Secondary | ICD-10-CM | POA: Diagnosis not present

## 2023-10-09 DIAGNOSIS — I10 Essential (primary) hypertension: Secondary | ICD-10-CM | POA: Diagnosis not present

## 2023-10-09 DIAGNOSIS — L4050 Arthropathic psoriasis, unspecified: Secondary | ICD-10-CM | POA: Diagnosis not present

## 2023-10-09 DIAGNOSIS — R55 Syncope and collapse: Secondary | ICD-10-CM | POA: Diagnosis not present

## 2023-10-09 DIAGNOSIS — Z6824 Body mass index (BMI) 24.0-24.9, adult: Secondary | ICD-10-CM | POA: Diagnosis not present

## 2023-10-09 DIAGNOSIS — R030 Elevated blood-pressure reading, without diagnosis of hypertension: Secondary | ICD-10-CM | POA: Diagnosis not present

## 2024-06-20 DIAGNOSIS — L4059 Other psoriatic arthropathy: Secondary | ICD-10-CM | POA: Diagnosis not present

## 2024-06-20 DIAGNOSIS — Z21 Asymptomatic human immunodeficiency virus [HIV] infection status: Secondary | ICD-10-CM | POA: Diagnosis not present

## 2024-06-20 DIAGNOSIS — F17210 Nicotine dependence, cigarettes, uncomplicated: Secondary | ICD-10-CM | POA: Diagnosis not present

## 2024-06-20 DIAGNOSIS — Z9225 Personal history of immunosupression therapy: Secondary | ICD-10-CM | POA: Diagnosis not present
